# Patient Record
Sex: Male | Born: 1935 | Race: White | Hispanic: No | Marital: Single
Health system: Southern US, Community
[De-identification: ages and names within clinical notes are randomized; demographics above are authoritative.]

---

## 2013-08-15 ENCOUNTER — Emergency Department: Payer: Self-pay

## 2013-08-15 LAB — COMPREHENSIVE METABOLIC PANEL
ALK PHOS: 183 U/L — AB
AST: 49 U/L — AB (ref 15–37)
Albumin: 2.9 g/dL — ABNORMAL LOW (ref 3.4–5.0)
Anion Gap: 7 (ref 7–16)
BILIRUBIN TOTAL: 0.5 mg/dL (ref 0.2–1.0)
BUN: 12 mg/dL (ref 7–18)
CALCIUM: 8.6 mg/dL (ref 8.5–10.1)
CO2: 26 mmol/L (ref 21–32)
CREATININE: 1.04 mg/dL (ref 0.60–1.30)
Chloride: 107 mmol/L (ref 98–107)
EGFR (African American): >60
EGFR (Non-African Amer.): >60
GLUCOSE: 95 mg/dL (ref 65–99)
OSMOLALITY: 279 (ref 275–301)
POTASSIUM: 4.5 mmol/L (ref 3.5–5.1)
SGPT (ALT): 21 U/L (ref 12–78)
Sodium: 140 mmol/L (ref 136–145)
Total Protein: 6.9 g/dL (ref 6.4–8.2)

## 2013-08-15 LAB — URINALYSIS, COMPLETE
BLOOD: NEGATIVE
Bacteria: NONE SEEN
Bilirubin,UR: NEGATIVE
Glucose,UR: NEGATIVE mg/dL (ref 0–75)
Ketone: NEGATIVE
Leukocyte Esterase: NEGATIVE
Nitrite: NEGATIVE
Ph: 8 (ref 4.5–8.0)
Protein: NEGATIVE
RBC,UR: <1 /HPF (ref 0–5)
SPECIFIC GRAVITY: 1.004 (ref 1.003–1.030)
Squamous Epithelial: <1
WBC UR: 1 /HPF (ref 0–5)

## 2013-08-15 LAB — TROPONIN I: Troponin-I: <0.02 ng/mL

## 2013-08-15 LAB — CBC
HCT: 42.4 % (ref 40.0–52.0)
HGB: 14.1 g/dL (ref 13.0–18.0)
MCH: 30.8 pg (ref 26.0–34.0)
MCHC: 33.1 g/dL (ref 32.0–36.0)
MCV: 93 fL (ref 80–100)
PLATELETS: 278 10*3/uL (ref 150–440)
RBC: 4.57 10*6/uL (ref 4.40–5.90)
RDW: 15.7 % — ABNORMAL HIGH (ref 11.5–14.5)
WBC: 13.8 10*3/uL — ABNORMAL HIGH (ref 3.8–10.6)

## 2013-08-15 LAB — LIPASE, BLOOD: Lipase: 131 U/L (ref 73–393)

## 2014-01-19 ENCOUNTER — Emergency Department: Payer: Self-pay | Admitting: Emergency Medicine

## 2014-01-19 ENCOUNTER — Ambulatory Visit (HOSPITAL_COMMUNITY)
Admission: AD | Admit: 2014-01-19 | Discharge: 2014-01-19 | Disposition: A | Discharge status: Home / Self Care | Payer: Medicare Other | Source: Other Acute Inpatient Hospital | Attending: Emergency Medicine | Admitting: Emergency Medicine

## 2014-01-19 DIAGNOSIS — D649 Anemia, unspecified: Secondary | ICD-10-CM | POA: Diagnosis present

## 2014-03-03 ENCOUNTER — Encounter: Payer: Self-pay | Admitting: Surgery

## 2014-04-01 ENCOUNTER — Encounter: Payer: Self-pay | Admitting: Surgery

## 2014-08-31 ENCOUNTER — Ambulatory Visit
Admit: 2014-08-31 | Disposition: A | Discharge status: Home / Self Care | Payer: Self-pay | Attending: Internal Medicine | Admitting: Internal Medicine

## 2014-09-12 IMAGING — CT CT HEAD WITHOUT CONTRAST
2 series · 15 of 30 positions shown, 19 images · non-contrast
Comparison: No priors.

CLINICAL DATA: Near syncope.

EXAM:
CT HEAD WITHOUT CONTRAST
TECHNIQUE: Contiguous axial images were obtained from the base of the skull
through the vertex without intravenous contrast.

[Series 2: head wo · axial · 0.48mm/px · z∈[+1274,+1399]mm · 13 of 31 slices shown, 17 images]
[im 3/31  brain]
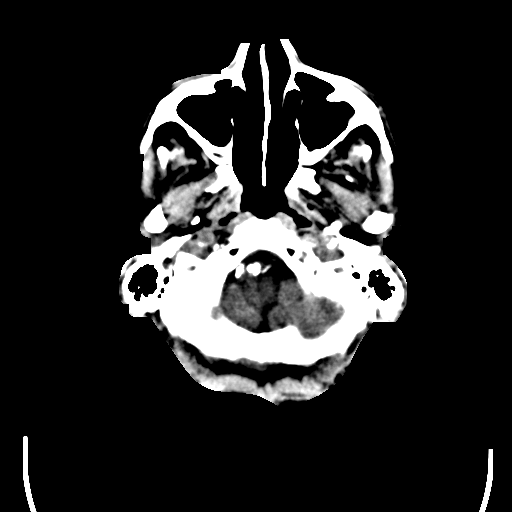
[im 3/31  bone]
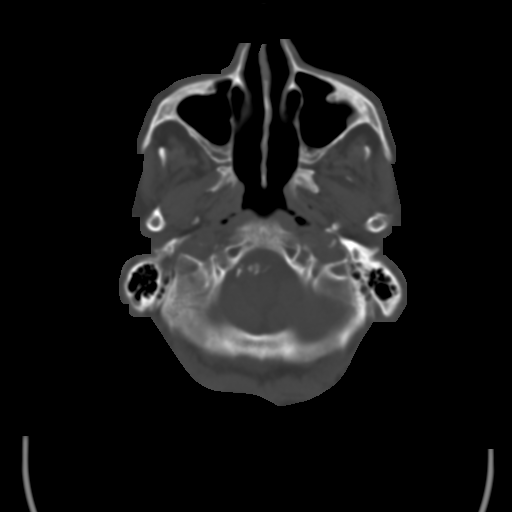
[im 5/31  brain]
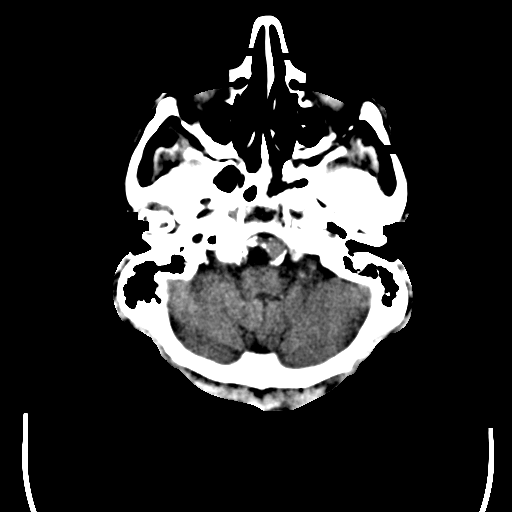
[im 7/31  brain]
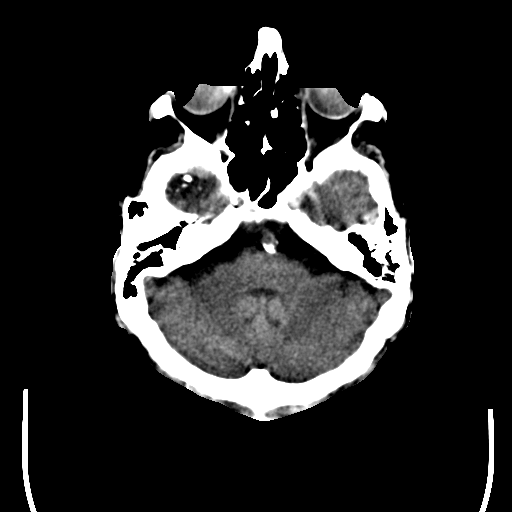
[im 9/31  brain]
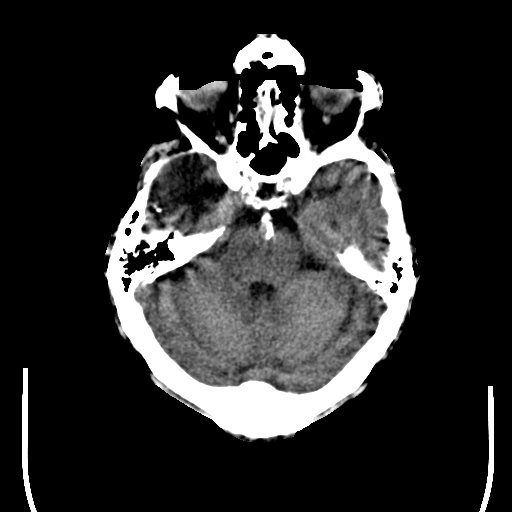
[im 11/31  brain]
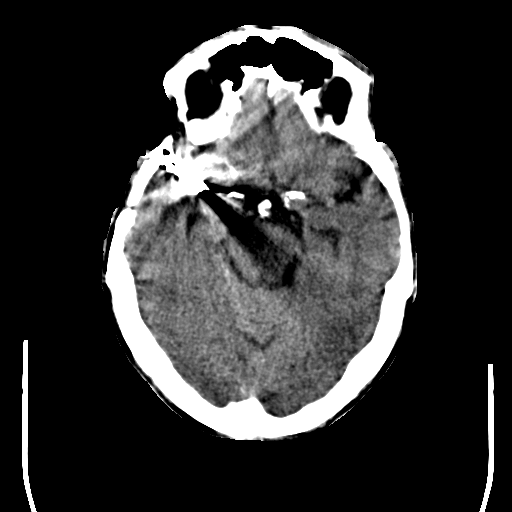
[im 11/31  bone]
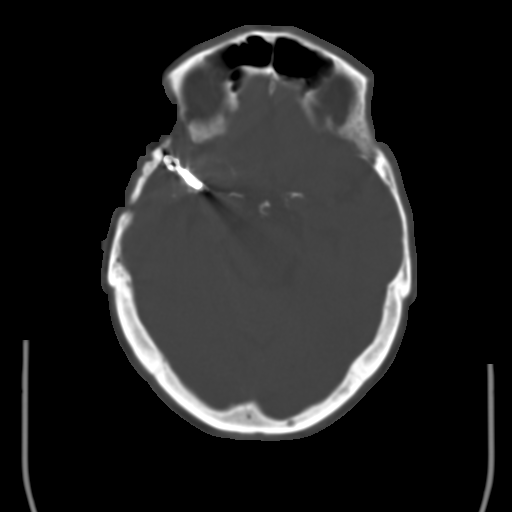
[im 13/31  brain]
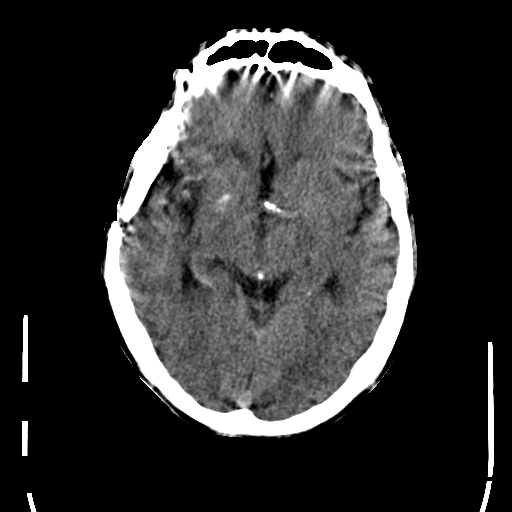
[im 16/31  brain]
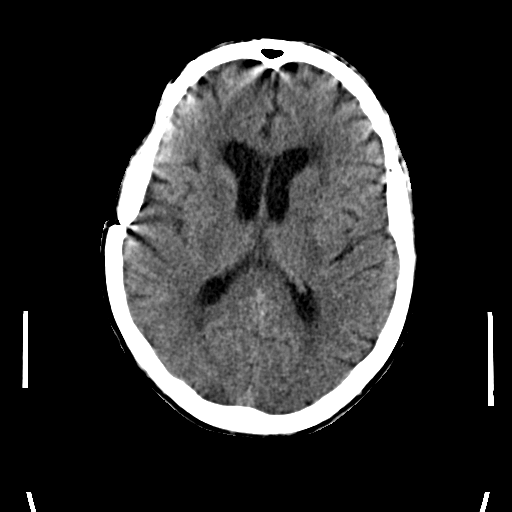
[im 18/31  brain]
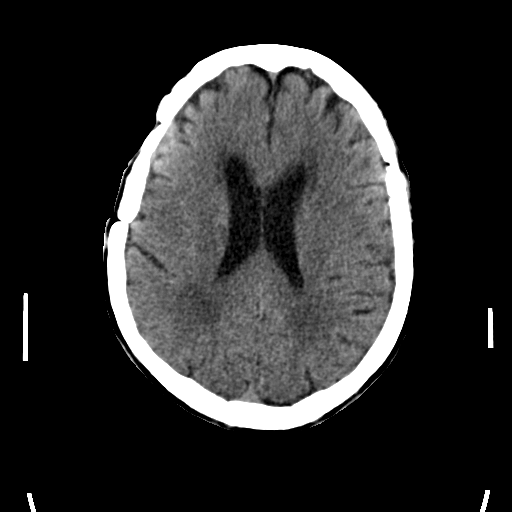
[im 20/31  brain]
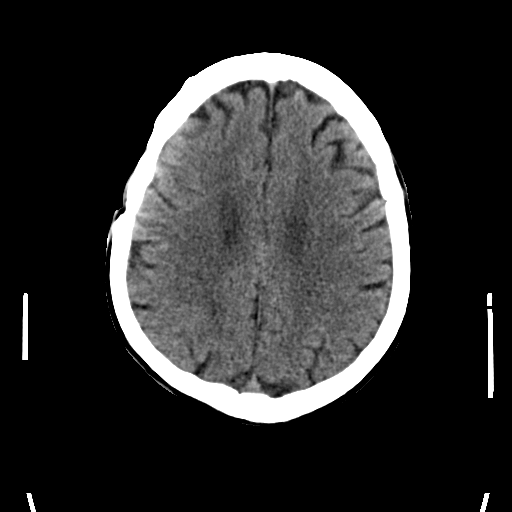
[im 20/31  bone]
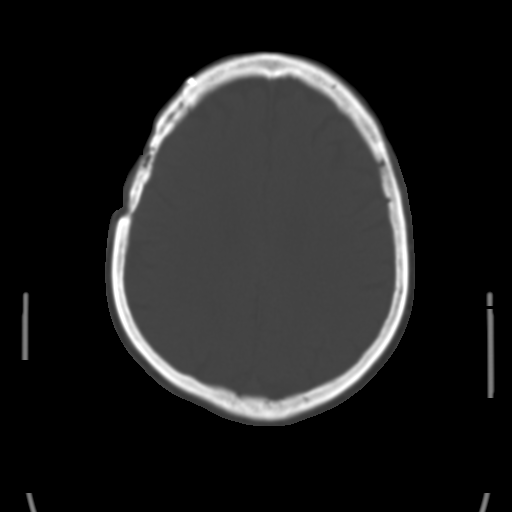
[im 22/31  brain]
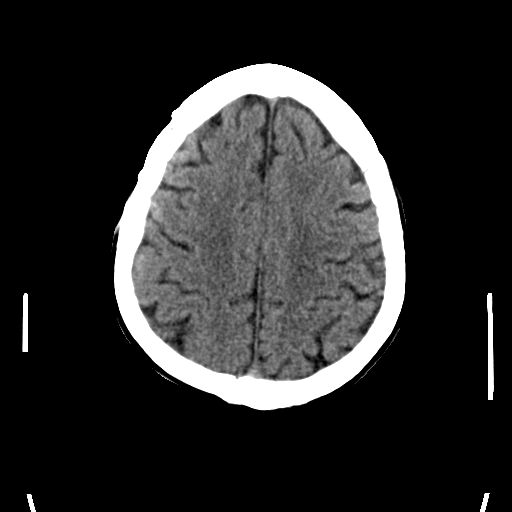
[im 24/31  brain]
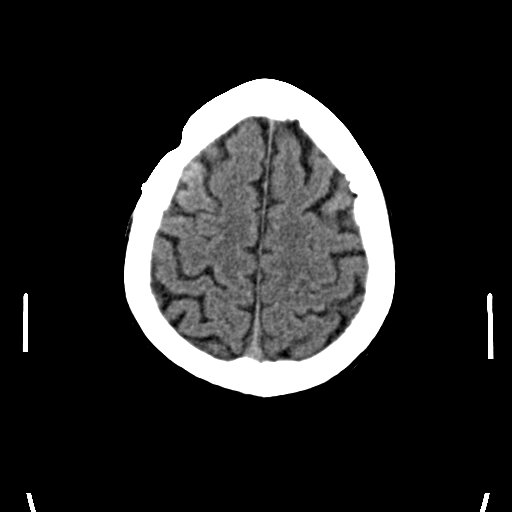
[im 26/31  brain]
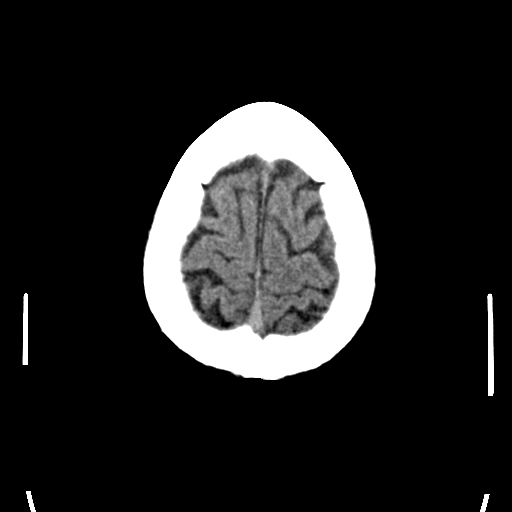
[im 28/31  brain]
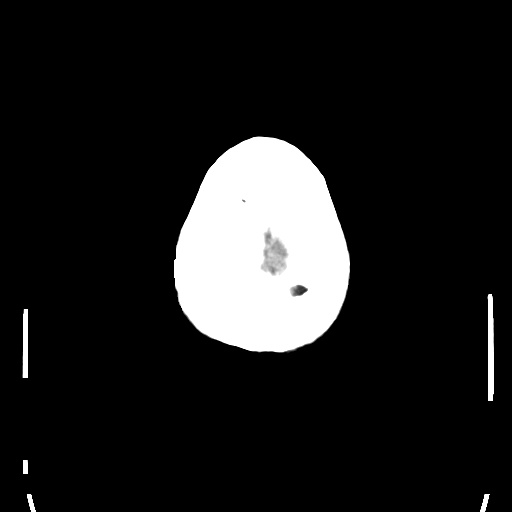
[im 28/31  bone]
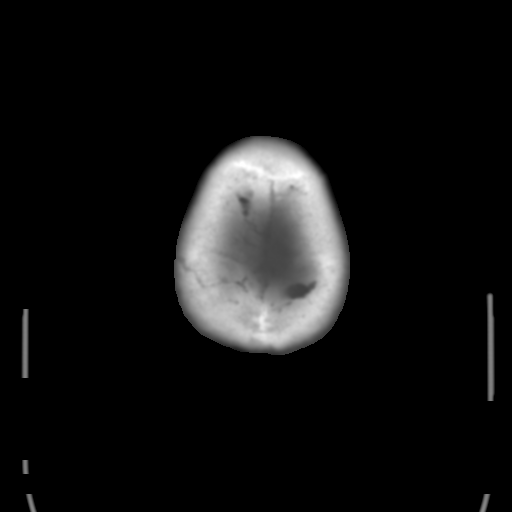

[Series 3: head bone · axial · 0.48mm/px · z∈[+1279,+1299]mm · 2 of 30 slices shown]
[im 3/30  bone]
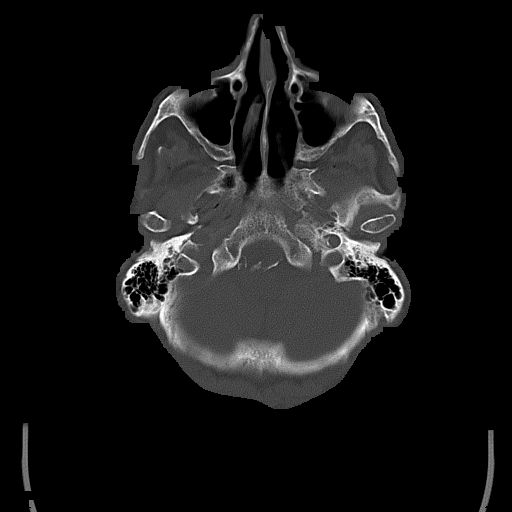
[im 7/30  bone]
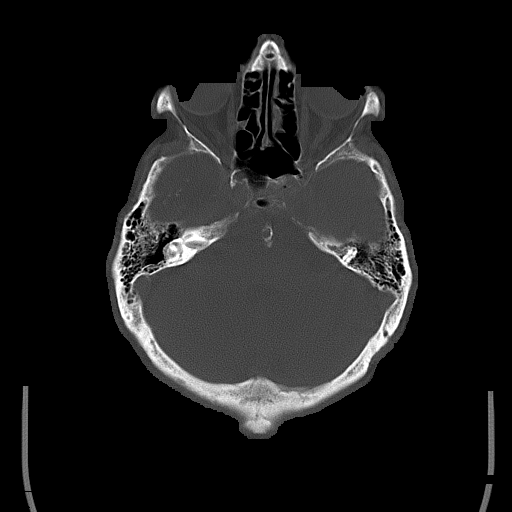

[15 of 30 positions shown; findings below may reference images not displayed]

FINDINGS: Extensive atherosclerosis of the internal carotid arteries,
vertebrobasilar system and the Circle of Willis. Mild cerebral and
cerebellar atrophy. Patchy and confluent areas of decreased
attenuation are noted throughout the deep and periventricular white
matter of the cerebral hemispheres bilaterally, compatible with
chronic microvascular ischemic disease. Status post right pterional
craniotomy for MCA territory aneurysm clipping. Low-attenuation in
the anterior aspect of the right temporal lobe, presumably
encephalomalacia related to prior ischemic event. Old lacunar
infarction in the head of the left caudate nucleus. No acute
intracranial abnormalities. Specifically, no evidence of acute
intracranial hemorrhage, no definite findings of acute/subacute
cerebral ischemia, no mass, mass effect, hydrocephalus or abnormal
intra or extra-axial fluid collections. Visualized paranasal sinuses
and mastoids are well pneumatized, with exception of some mild
multifocal mucosal thickening in the ethmoid and maxillary sinuses
bilaterally. No acute displaced skull fractures are identified.
IMPRESSION: 1. No acute intracranial abnormalities.
2. Post procedural changes of prior right MCA territory aneurysm
clipping, with encephalomalacia of the anterior aspect of the right
temporal lobe.
3. Mild cerebral and cerebellar atrophy with mild chronic
microvascular ischemic changes in the cerebral white matter and old
lacunar infarct in the head of the left caudate nucleus.
4. Severe atherosclerosis.

## 2014-10-01 ENCOUNTER — Ambulatory Visit
Admit: 2014-10-01 | Disposition: A | Discharge status: Home / Self Care | Payer: Self-pay | Attending: Internal Medicine | Admitting: Internal Medicine

## 2014-10-01 DEATH — deceased

## 2014-10-23 NOTE — Consult Note (Signed)
CHIEF COMPLAINT and HISTORY:  Subjective/Chief Complaint CC: presyncope AAA with back pain   History of Present Illness 81 year old male seen in ER this evening for 8.4cm AAA. History is taken mostly from patient's wife. He had a presyncopal episode today and was "out of it" but did not completely pass out per wife. He wasn't exactly confused, but he had difficulty remembering the situation. Wife reports EMS told her his BP was very low. Patient reports back pain for a few months which has been worsening. Wife reports for a few days. Denies abdominal pain or peripheral embolization. He was not aware of the AAA. He has had decreased appetite for the past 9 months and lost weight. During this time his blood pressure has been running about normal. He smokes 1 1/2 ppd. He typically gets health care at [facility].   PAST MEDICAL/SURGICAL HISTORY:  Past Medical History:   Hypertension:    Depression:    Brain aneurysm s/p surgery:    Seizures:    CVA:    abdominal aneurysm:   ALLERGIES:  Allergies:  Morphine: Agitation  Demerol HCl: Hallucinations  Dilantin: Rash  OTHER MEDICATIONS:  Medications Did not bring list of medication Believes he takes low dose BP medication and neurontin   Family and Social History:  Family History Hypertension  Stroke  No known AAA family hx   Social History positive  tobacco, positive  tobacco (Current within 1 year)   Review of Systems:  Fever/Chills No   Cough No   Sputum No   Abdominal Pain No   Diarrhea No   Constipation No   Nausea/Vomiting No   SOB/DOE No   Chest Pain No   Physical Exam:  GEN no acute distress   HEENT PERRL, hearing intact to voice, moist oral mucosa   NECK supple  No masses   RESP normal resp effort  clear BS   CARD regular rate  no carotid bruits   ABD denies tenderness  soft  normal BS  Palpable enlarged AAA   EXTR diminished pedal pulses, no ulcers   SKIN normal to palpation   NEURO cranial  nerves intact   PSYCH alert, A+O to time, place, person   LABS:  Laboratory Results: Hepatic:    14-Feb-15 16:03, Comprehensive Metabolic Panel  Bilirubin, Total 0.5  Alkaline Phosphatase 183  45-117  NOTE: New Reference Range  05/22/13  SGPT (ALT) 21  SGOT (AST) 49  Total Protein, Serum 6.9  Albumin, Serum 2.9  Routine Chem:  Glucose, Serum 95  BUN 12  Creatinine (comp) 1.04  Sodium, Serum 140  Potassium, Serum 4.5  Chloride, Serum 107  CO2, Serum 26  Calcium (Total), Serum 8.6  Osmolality (calc) 279  eGFR (African American) >60  eGFR (Non-African American) >60  eGFR values <6mL/min/1.73 m2 may be an indication of chronic  kidney disease (CKD).  Calculated eGFR is useful in patients with stable renal function.  The eGFR calculation will not be reliable in acutely ill patients  when serum creatinine is changing rapidly. It is not useful in   patients on dialysis. The eGFR calculation may not be applicable  to patients at the low and high extremes of body sizes, pregnant  women, and vegetarians.  Result Comment   potassium/ast - Slight hemolysis, interpret results with   - caution.   Result(s) reported on 15 Aug 2013 at 04:35PM.  Anion Gap 7    14-Feb-15 16:03, Lipase  Lipase 131  Result(s) reported on 15 Aug 2013 at 04:37PM.  Cardiac:    14-Feb-15 16:03, Troponin I  Troponin I < 0.02  0.00-0.05  0.05 ng/mL or less: NEGATIVE   Repeat testing in 3-6 hrs   if clinically indicated.  >0.05 ng/mL: POTENTIAL   MYOCARDIAL INJURY. Repeat   testing in 3-6 hrs if   clinically indicated.  NOTE: An increase or decrease   of 30% or more on serial   testing suggests a   clinically important change  Routine UA:    14-Feb-15 16:39, Urinalysis  Color (UA) Yellow  Clarity (UA) Clear  Glucose (UA) Negative  Bilirubin (UA) Negative  Ketones (UA) Negative  Specific Gravity (UA) 1.004  Blood (UA) Negative  pH (UA) 8.0  Protein (UA) Negative  Nitrite (UA) Negative   Leukocyte Esterase (UA) Negative  Result(s) reported on 15 Aug 2013 at 05:12PM.  RBC (UA) <1 /HPF  WBC (UA) 1 /HPF  Bacteria (UA)   NONE SEEN  Epithelial Cells (UA) <1 /HPF  Result(s) reported on 15 Aug 2013 at 05:12PM.  Routine Hem:    14-Feb-15 16:03, Hemogram, Platelet Count  WBC (CBC) 13.8  RBC (CBC) 4.57  Hemoglobin (CBC) 14.1  Hematocrit (CBC) 42.4  Platelet Count (CBC) 278  Result(s) reported on 15 Aug 2013 at 04:31PM.  MCV 93  MCH 30.8  MCHC 33.1  RDW 15.7   RADIOLOGY:  Radiology Results: LabUnknown:    14-Feb-15 17:21, CT Angiography Chest/Abd/Pelvis w/wo  PACS Image    14-Feb-15 17:21, CT Head Without Contrast  PACS Image  CT:    14-Feb-15 17:21, CT Angiography Chest/Abd/Pelvis w/wo  CT Angiography Chest/Abd/Pelvis w/wo  REASON FOR EXAM:    lower back pain and syncope; weight loss; smoker;   eval for AAA/ dissection  COMMENTS:       PROCEDURE: CT  - CT ANGIOGRAPHY CHEST/ABD/PELVIS  - Aug 15 2013  5:21PM     CLINICAL DATA:  Syncope, low back pain. Evaluate for abdominal  aortic aneurysm or dissection. Known aortic aneurysm, no prior  imaging at this institution.    EXAM:  CT ANGIOGRAPHY CHEST, ABDOMEN AND PELVIS    TECHNIQUE:  Multidetector CT imaging through the chest, abdomen and pelvis was  performed using the standard protocol during bolus administration of  intravenous contrast. Multiplanar reconstructed images and MIPs were  obtained and reviewed to evaluate the vascular anatomy.    CONTRAST:  100 cc Isovue 370 IV contrast    COMPARISON:  None.    FINDINGS:  CTA CHEST FINDINGS    Diffuse moderate emphysematous changes are noted. Scarring versus  atelectasis noted dependently at the lung bases. The lungs are  otherwise clear. Dependent secretions are noted within the a  trachea. Central airways are otherwise patent. Mild by a lateral  lower lobe bronchiectasis with central bronchial wall thickening.    At unenhanced imaging, moderate  atheromatous aortic calcification  and coronary arterial calcification noted without hyperdense  intramural aortic hematoma identified. No calcified dissection flap.  After administration of contrast, no dissection flap is identified.  No contrast extravasation or periaortic collection or hematoma is  identified. There is an infrarenal abdominal aortic aneurysm with  predominantly hypodense plaque that areas of internal calcification,  measuring 8.4 x 5.6 x 5.3 cm (craniocaudad by transverse by  anteroposterior). Celiac axis, superior mesenteric artery and  inferior mesenteric artery are patent. Two right and 1 left renal  artery are identified. Retroaortic left renal vein noted.    Review of the MIP images confirms the  above findings.  CTA ABDOMEN AND PELVIS FINDINGS    Cholecystectomy clips are noted. Mild central intrahepatic ductal  prominence is identified. Tapering to the level of the ampulla is  noted. Duodenal diverticulum is identified. No ascites, free air, or  lymphadenopathy. Innumerable bilateral renal cortical lesions are  noted of varying attenuation, some of which are low enough density  that can be definitively diagnosed as cysts, and others which may  represent hyperdense cysts although solid masses could appear  similar. Adrenal glands, atrophic pancreas, and spleen are  unremarkable.    No bowel wall thickening or focal segmental dilatation. The appendix  is normal. Bladder is unremarkable. Mild prostatic enlargement.  No acute osseous abnormality. The bones are subjectively osteopenic.  Multilevel disc degenerative change is noted with vertebra plana at  T12. No bony retropulsion. Mild S shaped thoracolumbar curvature is  noted. Mild bilateral glenohumeral joint degenerative change.    Review of the MIP images confirms the above findings.     IMPRESSION:  Infrarenal abdominal aortic aneurysm with extensive hypodense mural  plaque/ thrombus but no contrast  extravasation, periaortic  collection, or dissection flap identified. Major abdominal branch  vessels appear perfused.    Emphysema.  Innumerable renal cortical lesions bilaterally, of varying  densities, likely cysts although a small mass could appear similar  to a probable hyperdense cyst. Outpatient nonemergent renal  ultrasound is recommended for further evaluation for any possible  solid mass.    No acute intra thoracic, abdominal, or pelvic pathology.      Electronically Signed    By: Conchita Paris M.D.    On: 08/15/2013 17:39         Verified By: Arline Asp, M.D.,    14-Feb-15 17:21, CT Head Without Contrast  CT Head Without Contrast  REASON FOR EXAM:    syncope  COMMENTS:       PROCEDURE: CT  - CT HEAD WITHOUT CONTRAST  - Aug 15 2013  5:21PM     CLINICAL DATA:  Near syncope.    EXAM:  CT HEAD WITHOUT CONTRAST    TECHNIQUE:  Contiguous axial images were obtained from the base of the skull  through the vertex without intravenous contrast.    COMPARISON:  No priors.  FINDINGS:  Extensive atherosclerosis of the internal carotid arteries,  vertebrobasilar system and the Circle of Willis. Mild cerebral and  cerebellar atrophy. Patchy and confluent areas of decreased  attenuation are noted throughout the deep and periventricular white  matter of the cerebral hemispheres bilaterally, compatible with  chronic microvascular ischemic disease. Status post right pterional  craniotomy for MCA territory aneurysm clipping. Low-attenuation in  the anterior aspect of the right temporal lobe, presumably  encephalomalacia related to prior ischemic event. Old lacunar  infarction in the head of the left caudate nucleus. No acute  intracranial abnormalities. Specifically, no evidence of acute  intracranial hemorrhage, no definite findings of acute/subacute  cerebral ischemia, no mass, mass effect, hydrocephalus or abnormal  intra or extra-axial fluid collections.  Visualized paranasal sinuses  and mastoids are well pneumatized, with exception of some mild  multifocal mucosal thickening in the ethmoid and maxillary sinuses  bilaterally. No acute displaced skull fractures are identified.     IMPRESSION:  1. No acute intracranial abnormalities.  2. Post procedural changes of prior right MCA territory aneurysm  clipping, with encephalomalacia of the anterior aspect of the right  temporal lobe.  3. Mild cerebral and cerebellar atrophy with mild chronic  microvascular  ischemic changes in the cerebral white matter and old  lacunar infarct in the head of the left caudate nucleus.  4. Severe atherosclerosis.    Electronically Signed    By: Vinnie Langton M.D.    On: 08/15/2013 17:25         Verified By: Etheleen Mayhew, M.D.,   ASSESSMENT AND PLAN:  Plan Abdominal aortic aneurysm measuring 8.4cm maximal diameter on CT. D/w Dr. Lucky Cowboy. Would recommend AAA repair as soon as able. Recommend cardiology evaluation for clearance- hopefully can be seen tomorrow, then plan for AAA repair Monday or Tuesday. Dr. Lucky Cowboy will review scan to determine if he is a candidate for stent graft repair.  Discussed with patient and his wife. All questions answered. They mentioned that ER staff is contacting [place] to see if he needs to go there or if can stay at [facility]. Patient mentions he wants to go home instead of being admitted. I discussed the risks of his aneurysm at this size and rupture risk along with fatality, and again the above recommendations. He verbalizes understanding. Wife trying to convince him to stay. He will consider it.   Electronic Signatures for Addendum Section:  Su Grand (PA-C) (Signed Addendum 15-Feb-15 04:04)  Dr. Lucky Cowboy reviewed CT angiogram.  Called ER to discuss that the radiology report was very misleading by putting in the craniocaudal length of the AAA first, which is not an important factor in general and the 8.4 cm length really does not  have any bearing on rupture risk.  This is actually a 5.5 cm in maximal diameter infrarenal aneurysm that begins below the renals (accessory right being the lowest) and could be fixed with a stent graft.  This is still big enough to consider repair on and in general repair would be recommended after his syncopal work up and appropriate preoperative clearance exams.  The patient has apparently decided to leave AMA despite being counselled otherwise by the ER Physicians and our PA.  We will be happy to see as outpatient and repair if he is agreeable.   Electronic Signatures: Su Grand (PA-C)  (Signed 14-Feb-15 19:49)  Authored: Chief Complaint and History, PAST MEDICAL/SURGICAL HISTORY, ALLERGIES, HOME MEDICATIONS, Other Medications, Family and Social History, Review of Systems, Physical Exam, LABS, RADIOLOGY, Assessment and Plan   Last Updated: 15-Feb-15 04:04 by Su Grand (PA-C)

## 2015-10-11 IMAGING — CR DG CHEST 1V PORT
1 series · 1 of 1 positions shown · non-contrast
Comparison: None.

CLINICAL DATA: Decreased oxygen saturation.  COPD.

EXAM:
PORTABLE CHEST - 1 VIEW

[ap]
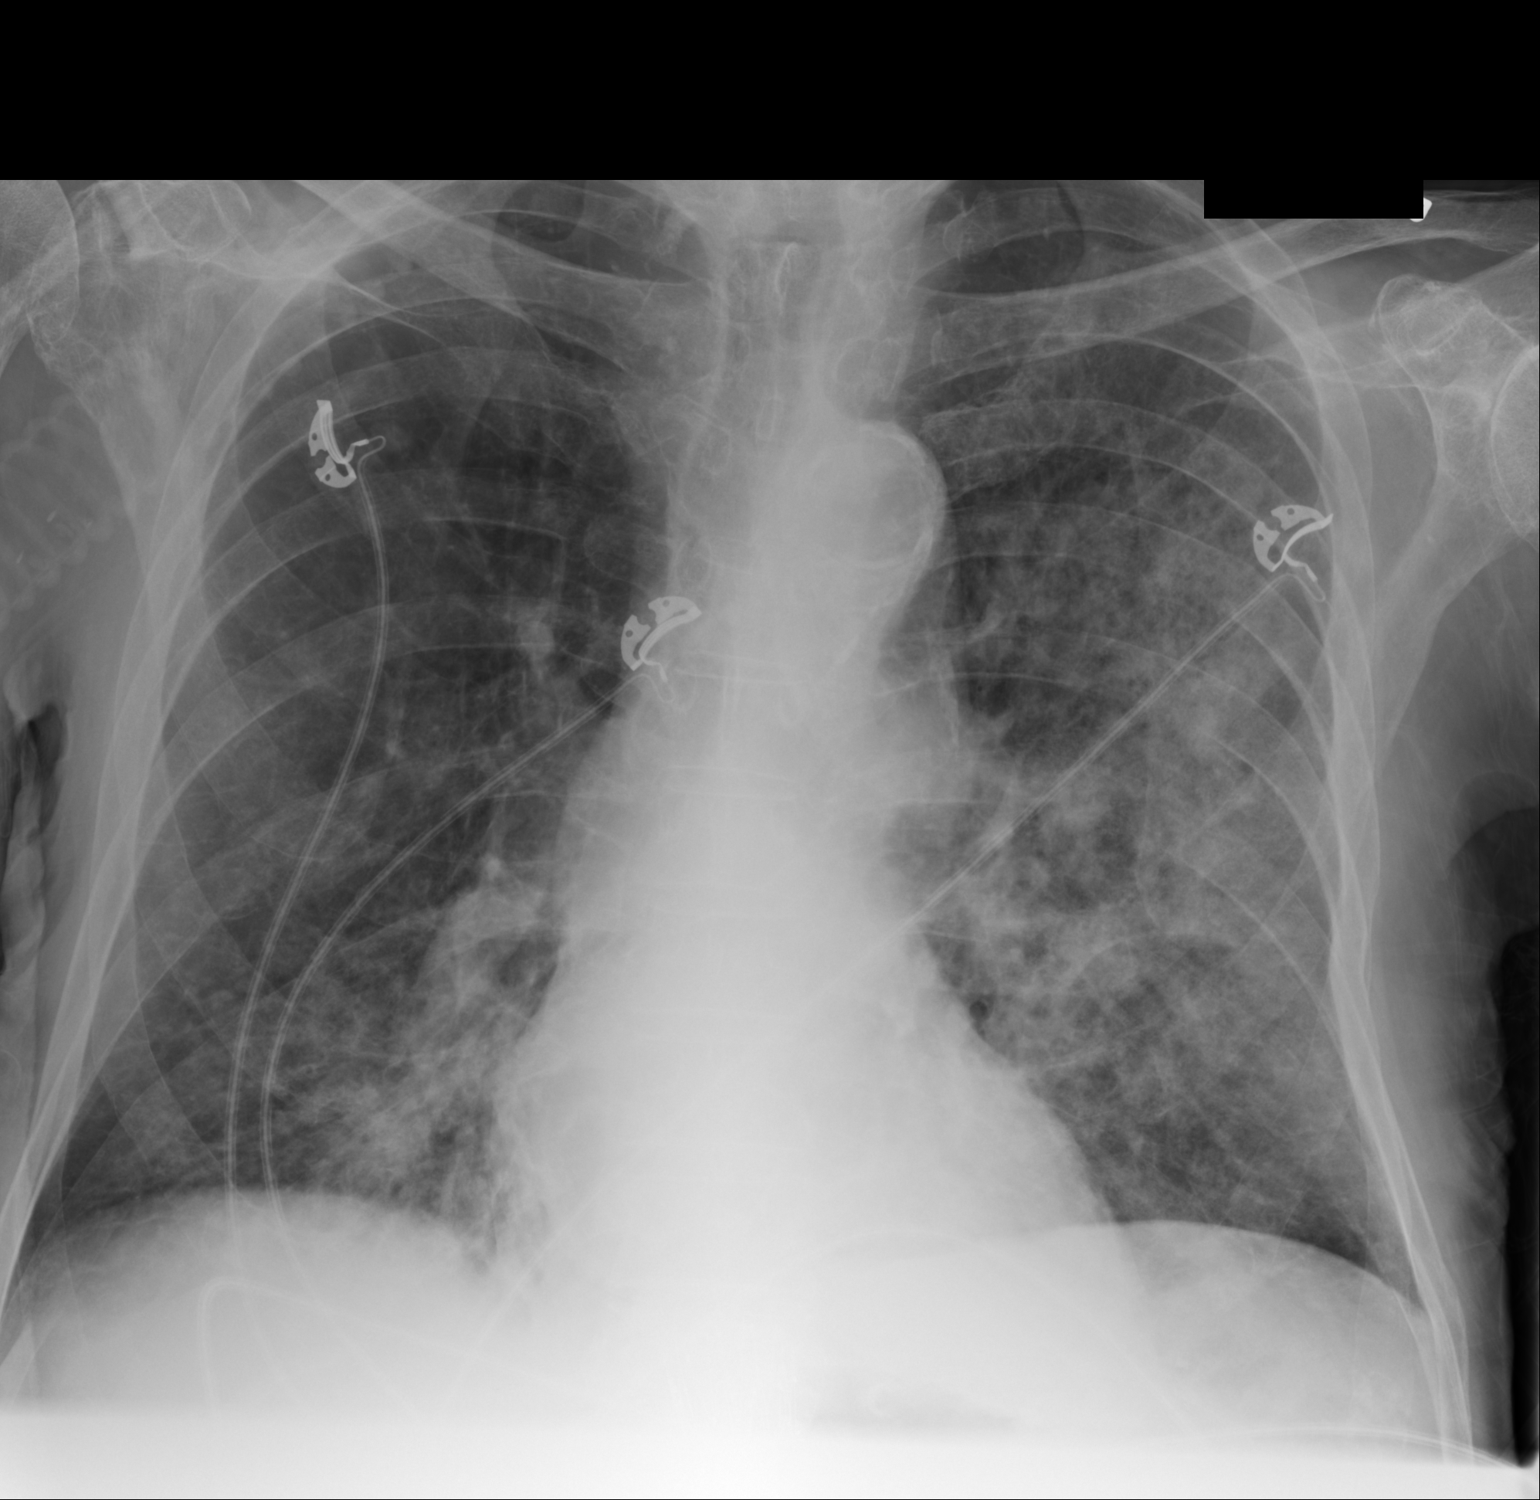

[1 of 1 positions shown; findings below may reference images not displayed]

FINDINGS: The lungs are emphysematous. Airspace disease is seen throughout the
left chest pain and in the right lung base. Heart size is normal. No
pneumothorax or pleural effusion.
IMPRESSION: Emphysema with left worse than right airspace disease most
consistent with multifocal pneumonia.
